# Patient Record
Sex: Male | Born: 1995 | Race: Black or African American | Hispanic: No | Marital: Single | Smoking: Current some day smoker
Health system: Southern US, Community
[De-identification: ages and names within clinical notes are randomized; demographics above are authoritative.]

## PROBLEM LIST (undated history)

## (undated) DIAGNOSIS — Q681 Congenital deformity of finger(s) and hand: Secondary | ICD-10-CM

## (undated) DIAGNOSIS — F32A Depression, unspecified: Secondary | ICD-10-CM

## (undated) DIAGNOSIS — F329 Major depressive disorder, single episode, unspecified: Secondary | ICD-10-CM

## (undated) HISTORY — PX: HAND SURGERY: SHX662

---
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
## 2013-04-23 ENCOUNTER — Emergency Department (HOSPITAL_COMMUNITY): Payer: Medicaid Other

## 2013-04-23 ENCOUNTER — Encounter (HOSPITAL_COMMUNITY): Payer: Self-pay | Admitting: Emergency Medicine

## 2013-04-23 ENCOUNTER — Emergency Department (HOSPITAL_COMMUNITY)
Admission: EM | Admit: 2013-04-23 | Discharge: 2013-04-24 | Disposition: A | Discharge status: Home / Self Care | Payer: Medicaid Other | Attending: Emergency Medicine | Admitting: Emergency Medicine

## 2013-04-23 DIAGNOSIS — M25532 Pain in left wrist: Secondary | ICD-10-CM

## 2013-04-23 DIAGNOSIS — S6990XA Unspecified injury of unspecified wrist, hand and finger(s), initial encounter: Secondary | ICD-10-CM | POA: Insufficient documentation

## 2013-04-23 DIAGNOSIS — Y9239 Other specified sports and athletic area as the place of occurrence of the external cause: Secondary | ICD-10-CM | POA: Insufficient documentation

## 2013-04-23 DIAGNOSIS — F17200 Nicotine dependence, unspecified, uncomplicated: Secondary | ICD-10-CM | POA: Insufficient documentation

## 2013-04-23 DIAGNOSIS — W219XXA Striking against or struck by unspecified sports equipment, initial encounter: Secondary | ICD-10-CM | POA: Insufficient documentation

## 2013-04-23 DIAGNOSIS — Y9367 Activity, basketball: Secondary | ICD-10-CM | POA: Insufficient documentation

## 2013-04-23 DIAGNOSIS — Z87768 Personal history of other specified (corrected) congenital malformations of integument, limbs and musculoskeletal system: Secondary | ICD-10-CM | POA: Insufficient documentation

## 2013-04-23 DIAGNOSIS — Z8776 Personal history of (corrected) congenital malformations of integument, limbs and musculoskeletal system: Secondary | ICD-10-CM | POA: Insufficient documentation

## 2013-04-23 DIAGNOSIS — S59909A Unspecified injury of unspecified elbow, initial encounter: Secondary | ICD-10-CM | POA: Insufficient documentation
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
## 2013-04-24 MED ORDER — IBUPROFEN 800 MG PO TABS: 800.0000 mg | ORAL_TABLET | Freq: Three times a day (TID) | ORAL | Status: DC

## 2013-04-24 NOTE — ED Provider Notes (Signed)
Medical screening examination/treatment/procedure(s) were performed by non-physician practitioner and as supervising physician I was immediately available for consultation/collaboration.   Brandt Loosen, MD 04/24/13 [phone]
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
## 2013-05-07 ENCOUNTER — Encounter (HOSPITAL_COMMUNITY): Payer: Self-pay | Admitting: Emergency Medicine

## 2013-05-07 ENCOUNTER — Emergency Department (HOSPITAL_COMMUNITY)
Admission: EM | Admit: 2013-05-07 | Discharge: 2013-05-07 | Disposition: A | Discharge status: Home / Self Care | Payer: Medicaid Other | Attending: Emergency Medicine | Admitting: Emergency Medicine

## 2013-05-07 DIAGNOSIS — R1110 Vomiting, unspecified: Secondary | ICD-10-CM | POA: Insufficient documentation

## 2013-05-07 DIAGNOSIS — R5381 Other malaise: Secondary | ICD-10-CM | POA: Insufficient documentation

## 2013-05-07 DIAGNOSIS — Q740 Other congenital malformations of upper limb(s), including shoulder girdle: Secondary | ICD-10-CM | POA: Insufficient documentation

## 2013-05-07 DIAGNOSIS — R072 Precordial pain: Secondary | ICD-10-CM | POA: Insufficient documentation

## 2013-05-07 DIAGNOSIS — F17200 Nicotine dependence, unspecified, uncomplicated: Secondary | ICD-10-CM | POA: Insufficient documentation

## 2013-05-07 DIAGNOSIS — IMO0001 Reserved for inherently not codable concepts without codable children: Secondary | ICD-10-CM | POA: Insufficient documentation

## 2013-05-07 DIAGNOSIS — J069 Acute upper respiratory infection, unspecified: Secondary | ICD-10-CM | POA: Insufficient documentation

## 2013-05-07 MED ORDER — ACETAMINOPHEN 325 MG PO TABS
650.0000 mg | ORAL_TABLET | Freq: Once | ORAL | Status: AC
  Administered 2013-05-07: 650 mg via ORAL
  Filled 2013-05-07: qty 2

## 2013-05-07 MED ORDER — HYDROCOD POLST-CHLORPHEN POLST 10-8 MG/5ML PO LQCR: 5.0000 mL | Freq: Two times a day (BID) | ORAL | Status: DC | PRN

## 2013-05-07 NOTE — ED Provider Notes (Signed)
CSN: [id number]     Arrival date & time 05/07/13  1648 History  This chart was scribed for non-physician practitioner Darrell Ledger, PA-C working with Darrell Marion, MD by Darrell Jensen, ED Scribe. This patient was seen in room Darrell Jensen and the patient's care was started at 1648.    Chief Complaint  Patient presents with  . URI  . Cough    The history is provided by the patient. No language interpreter was used.    HPI Comments: Darrell Jensen is a 17 y.o. male with a PMH of a congenital hand deformity who presents to the Emergency Department complaining of gradual onset, constant nasal congestion that began 2 days ago. Pt states having an associated, intermittent cough that is non-productive.  He also has associated mid-sternal pain that is only present with coughing. No chest pain at rest. He also reports fatigue and myalgias.  Pt also reports having 3 episodes of post-tussive emesis today that brought up clear liquid. No hemoptysis.  He had a sore throat initially with the onset of his symptoms, which has already resolved. Pt states he has had the flu vaccine for this season. He has taken some Mucinex with mild relief. He denies fever, chills, change in appetite/activity, nausea, neck pain, neck stiffness, SOB, dysuria, diarrhea, fever, rash, abdominal pain, constipation, diarrhea, lower extremity swelling, calf pain, headache, dizziness, or lightheadedness. He denies any sick contacts.  No history of heart problems or clotting/DVT/PE in the past.  No recent travel or surgery.     Past Medical History  Diagnosis Date  . Hand deformity, congenital    Past Surgical History  Procedure Laterality Date  . Hand surgery     Family History  Problem Relation Age of Onset  . Hypertension Other    History  Substance Use Topics  . Smoking status: Current Every Day Smoker -- 0.10 packs/day    Types: Cigarettes  . Smokeless tobacco: Not on file  . Alcohol Use: No    Review of Systems   Constitutional: Positive for fatigue. Negative for fever, chills, diaphoresis, activity change and appetite change.  HENT: Positive for congestion, rhinorrhea and sore throat (resolved). Negative for ear pain, nosebleeds, sinus pressure, sneezing, tinnitus, trouble swallowing and voice change.   Eyes: Negative for photophobia and visual disturbance.  Respiratory: Positive for cough. Negative for shortness of breath and wheezing.   Cardiovascular: Positive for chest pain (intermittent). Negative for leg swelling.  Gastrointestinal: Positive for vomiting. Negative for nausea, abdominal pain, diarrhea and constipation.  Genitourinary: Negative for dysuria.  Musculoskeletal: Positive for myalgias. Negative for arthralgias, back pain, joint swelling, neck pain and neck stiffness.  Skin: Negative for rash.  Neurological: Negative for dizziness, syncope, weakness, light-headedness, numbness and headaches.  All other systems reviewed and are negative.    Allergies  Review of patient's allergies indicates no known allergies.  Home Medications   Current Outpatient Rx  Name  Route  Sig  Dispense  Refill  . ibuprofen (ADVIL,MOTRIN) 800 MG tablet   Oral   Take 800 mg by mouth every 8 (eight) hours as needed for pain.          BP 135/73  Pulse 97  Temp(Src) 98.9 F (37.2 C) (Oral)  Resp 18  Ht 5\' 11"  (1.803 m)  Wt 188 lb 1 oz (85.305 kg)  BMI 26.24 kg/m2  SpO2 97%  Filed Vitals:   05/07/13 1651 05/07/13 1740  BP: 135/73 128/75  Pulse: 97 92  Temp:  98.9 F (37.2 C) 98.6 F (37 C)  TempSrc: Oral   Resp: 18 18  Height: 5\' 11"  (1.803 m)   Weight: 188 lb 1 oz (85.305 kg)   SpO2: 97% 98%    Physical Exam  Nursing note and vitals reviewed. Constitutional: He is oriented to person, place, and time. He appears well-developed and well-nourished. No distress.  HENT:  Head: Normocephalic and atraumatic.  Right Ear: External ear normal.  Left Ear: External ear normal.  Nose: Nose  normal.  Mouth/Throat: Oropharynx is clear and moist. No oropharyngeal exudate.  Uvula midline. No trismus. No erythema or exudates to the posterior pharynx.  Rhinorrhea.  Nasal congestion.  TM's gray and translucent bilaterally.  No sinus tenderness.    Eyes: Conjunctivae and EOM are normal. Pupils are equal, round, and reactive to light. Right eye exhibits no discharge. Left eye exhibits no discharge.  Neck: Normal range of motion. Neck supple.  No LAD or tenderness to the neck throughout  Cardiovascular: Normal rate, regular rhythm and intact distal pulses.  Exam reveals no gallop and no friction rub.   No murmur heard. Pulmonary/Chest: Effort normal and breath sounds normal. No respiratory distress. He has no wheezes. He has no rales. He exhibits tenderness.  Mid-sternal tenderness to palpation  Abdominal: Soft. Bowel sounds are normal. He exhibits no distension and no mass. There is no tenderness. There is no rebound and no guarding.  Musculoskeletal: Normal range of motion. He exhibits no edema and no tenderness.  No calf tenderness or edema bilaterally.  Patient able to ambulate without difficulty or ataxia   Lymphadenopathy:    He has no cervical adenopathy.  Neurological: He is alert and oriented to person, place, and time.  Skin: Skin is warm and dry. No rash noted. He is not diaphoretic.  Psychiatric: He has a normal mood and affect. His behavior is normal.    ED Course  Procedures   DIAGNOSTIC STUDIES: Oxygen Saturation is 97% on RA, normal by my interpretation.    COORDINATION OF CARE: 5:18 PM Discussed treatment plan with pt at bedside and pt agreed to plan.   Labs Review Labs Reviewed - No data to display Imaging Review No results found.  EKG Interpretation   None       MDM   1. URI (upper respiratory infection)     Darrell Jensen is a 17 y.o. male with a PMH of a congenital hand deformity who presents to the Emergency Department complaining of gradual  onset, constant nasal congestion that began 2 days ago.  Tylenol ordered.      Etiology of multiple complaints is likely due to an URI.  Patient was afebrile, non-toxic, and remained in no acute distress throughout his ED visit.  His Wells score was 0 and he is PERC negative.  He had no complaints of SOB and his vital signs were stable.  He had chest pain with coughing only and no chest pain at rest, which was reproducible.  He has low risk for cardiac disease.  Patient was prescribed Tussionex for outpatient management of his cough.  Patient instructed to follow-up with a PCP if his symptoms are not improving or worsening.  Patient encouraged to try Sudafed or other nasal decongestants.  Patient was instructed to return to the ED if they experience any coughing up blood, fever, difficulty breathing/swallowing, stiff neck/neck swelling, severe headache, or other concerns.  Patient was in agreement with discharge and plan.     Final impressions:  1. URI     Luiz Iron PA-C   I personally performed the services described in this documentation, which was scribed in my presence. The recorded information has been reviewed and is accurate.       Darrell Ledger, PA-C 05/09/13 1041

## 2013-05-07 NOTE — ED Notes (Signed)
Pt c/o URI sx with body aches x 2 days; pt sts nasal congestion

## 2013-05-12 NOTE — ED Provider Notes (Signed)
Medical screening examination/treatment/procedure(s) were performed by non-physician practitioner and as supervising physician I was immediately available for consultation/collaboration.  EKG Interpretation   None         Roney Marion, MD 05/12/13 1106

## 2013-08-01 ENCOUNTER — Emergency Department (HOSPITAL_COMMUNITY)
Admission: EM | Admit: 2013-08-01 | Discharge: 2013-08-02 | Disposition: A | Discharge status: Home / Self Care | Payer: Medicaid Other | Attending: Emergency Medicine | Admitting: Emergency Medicine

## 2013-08-01 ENCOUNTER — Encounter (HOSPITAL_COMMUNITY): Payer: Self-pay | Admitting: Emergency Medicine

## 2013-08-01 DIAGNOSIS — Y9210 Unspecified residential institution as the place of occurrence of the external cause: Secondary | ICD-10-CM | POA: Insufficient documentation

## 2013-08-01 DIAGNOSIS — W2209XA Striking against other stationary object, initial encounter: Secondary | ICD-10-CM | POA: Insufficient documentation

## 2013-08-01 DIAGNOSIS — Y9389 Activity, other specified: Secondary | ICD-10-CM | POA: Insufficient documentation

## 2013-08-01 DIAGNOSIS — Z87798 Personal history of other (corrected) congenital malformations: Secondary | ICD-10-CM | POA: Insufficient documentation

## 2013-08-01 DIAGNOSIS — F17200 Nicotine dependence, unspecified, uncomplicated: Secondary | ICD-10-CM | POA: Insufficient documentation

## 2013-08-01 DIAGNOSIS — S61411A Laceration without foreign body of right hand, initial encounter: Secondary | ICD-10-CM

## 2013-08-01 DIAGNOSIS — S61409A Unspecified open wound of unspecified hand, initial encounter: Secondary | ICD-10-CM | POA: Insufficient documentation

## 2013-08-01 NOTE — ED Notes (Signed)
Laceration rt thumb on a telephone.  He has deformity in his fingers normallybut the pt also has difficulty straightening his middle finger

## 2013-08-02 ENCOUNTER — Emergency Department (HOSPITAL_COMMUNITY): Payer: Medicaid Other
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
## 2013-08-02 NOTE — ED Provider Notes (Signed)
Medical screening examination/treatment/procedure(s) were performed by non-physician practitioner and as supervising physician I was immediately available for consultation/collaboration.    Rubi Tooley M Vermell Madrid, MD 08/02/13 1659 

## 2013-08-02 NOTE — Discharge Instructions (Signed)
Take 325mg  tylenol or 600-800mg  ibuprofen as needed for pain.  Keep wound clean and dry.  [facility] your primary [facility] doctor [facility] [phone]; [address])  in 7-10 days for wound recheck and suture removal.  You should be seen sooner if you develop fever, worsening pain or redness/drainage of pus at site of wound.

## 2014-03-25 ENCOUNTER — Encounter (HOSPITAL_COMMUNITY): Payer: Self-pay | Admitting: Emergency Medicine

## 2014-03-25 ENCOUNTER — Emergency Department (HOSPITAL_COMMUNITY): Payer: Medicaid Other

## 2014-03-25 ENCOUNTER — Emergency Department (HOSPITAL_COMMUNITY)
Admission: EM | Admit: 2014-03-25 | Discharge: 2014-03-25 | Disposition: A | Discharge status: Home / Self Care | Payer: Medicaid Other | Attending: Emergency Medicine | Admitting: Emergency Medicine

## 2014-03-25 DIAGNOSIS — Z87768 Personal history of other specified (corrected) congenital malformations of integument, limbs and musculoskeletal system: Secondary | ICD-10-CM | POA: Insufficient documentation

## 2014-03-25 DIAGNOSIS — Z8776 Personal history of (corrected) congenital malformations of integument, limbs and musculoskeletal system: Secondary | ICD-10-CM | POA: Diagnosis not present

## 2014-03-25 DIAGNOSIS — F17200 Nicotine dependence, unspecified, uncomplicated: Secondary | ICD-10-CM | POA: Insufficient documentation

## 2014-03-25 DIAGNOSIS — M79645 Pain in left finger(s): Secondary | ICD-10-CM

## 2014-03-25 DIAGNOSIS — M79609 Pain in unspecified limb: Secondary | ICD-10-CM | POA: Insufficient documentation

## 2014-03-25 MED ORDER — TRAMADOL HCL 50 MG PO TABS: 50.0000 mg | ORAL_TABLET | Freq: Four times a day (QID) | ORAL | Status: DC | PRN

## 2014-03-25 MED ORDER — TRAMADOL HCL 50 MG PO TABS
50.0000 mg | ORAL_TABLET | Freq: Once | ORAL | Status: AC
  Administered 2014-03-25: 50 mg via ORAL
  Filled 2014-03-25: qty 1

## 2014-03-25 NOTE — ED Notes (Signed)
Declined W/C at D/C and was escorted to lobby by RN. 

## 2014-03-25 NOTE — ED Notes (Addendum)
Pt reports left pinky pain, thinks it might be broken but doesn't remember hurting it. Reports got out of prison in June, has been hurting since before but parole officer just allowed him to leave his house today. Can move fingers. Sensation present. Has congenital hand disease

## 2014-03-25 NOTE — ED Provider Notes (Signed)
Medical screening examination/treatment/procedure(s) were performed by non-physician practitioner and as supervising physician I was immediately available for consultation/collaboration.  Flint Melter, MD 03/25/14 [phone]

## 2014-03-25 NOTE — ED Provider Notes (Signed)
CSN: [id number]     Arrival date & time 03/25/14  1451 History  This chart was scribed for non-physician practitioner, Junious Silk, PA-C,working with Flint Melter, MD, by Karle Plumber, ED Scribe. This patient was seen in room TR09C/TR09C and the patient's care was started at 4:37 PM.  Chief Complaint  Patient presents with  . Finger Injury   The history is provided by the patient. No language interpreter was used.   HPI Comments:  Darrell Jensen is a 18 y.o. male who presents to the Emergency Department complaining of an injury to his left fifth finger that started two days ago. Pt states he does not recall any trauma or injury to the finger but states he just woke up with pain one morning. He states the finger "just doesn't look right". He has taken Ibuprofen for the pain with minimal relief. He denies numbness or tingling of the finger or left hand. He is right hand dominant. He does have congenital hand condition causing his fingers to be deformed.   Past Medical History  Diagnosis Date  . Hand deformity, congenital    Past Surgical History  Procedure Laterality Date  . Hand surgery     Family History  Problem Relation Age of Onset  . Hypertension Other    History  Substance Use Topics  . Smoking status: Current Every Day Smoker -- 0.10 packs/day    Types: Cigarettes  . Smokeless tobacco: Not on file  . Alcohol Use: No    Review of Systems  Musculoskeletal: Positive for arthralgias.  All other systems reviewed and are negative.   Allergies  Review of patient's allergies indicates no known allergies.  Home Medications   Prior to Admission medications   Medication Sig Start Date End Date Taking? Authorizing Provider  ibuprofen (ADVIL,MOTRIN) 800 MG tablet Take 800 mg by mouth every 8 (eight) hours as needed for pain.   Yes Historical Provider, MD   Triage Vitals: BP 113/70  Pulse 64  Temp(Src) 98.4 F (36.9 C) (Oral)  Resp 15  SpO2 100% Physical Exam   Nursing note and vitals reviewed. Constitutional: He is oriented to person, place, and time. He appears well-developed and well-nourished. No distress.  HENT:  Head: Normocephalic and atraumatic.  Right Ear: External ear normal.  Left Ear: External ear normal.  Nose: Nose normal.  Eyes: Conjunctivae are normal.  Neck: Normal range of motion. No tracheal deviation present.  Cardiovascular: Normal rate, regular rhythm, normal heart sounds, intact distal pulses and normal pulses.   Pulses:      Radial pulses are 2+ on the right side, and 2+ on the left side.  Capillary refill < 3 seconds in all fingers.   Pulmonary/Chest: Effort normal and breath sounds normal. No stridor.  Abdominal: Soft. He exhibits no distension. There is no tenderness.  Musculoskeletal: Normal range of motion.  Deformities to all fingers. Tender to palpation over left fifth finger. No swelling, erythema, redness. Sensation intact. Full ROM.   Neurological: He is alert and oriented to person, place, and time.  Skin: Skin is warm and dry. He is not diaphoretic.  Psychiatric: He has a normal mood and affect. His behavior is normal.    ED Course  Procedures (including critical care time) DIAGNOSTIC STUDIES: Oxygen Saturation is 100% on RA, normal by my interpretation.   COORDINATION OF CARE: 4:40 PM- Will splint finger and refer to hand specialist. Will prescribe pain medication. Pt verbalizes understanding and agrees to plan.  Medications  traMADol (ULTRAM) tablet 50 mg (50 mg Oral Given 03/25/14 1654)    Labs Review Labs Reviewed - No data to display  Imaging Review Dg Finger Little Left  03/25/2014   CLINICAL DATA:  Left fifth finger pain.  EXAM: LEFT LITTLE FINGER 2+V  COMPARISON:  March 25, 2008.  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 No fracture is noted. There appears to be possible subluxation of the fifth middle phalanx relative to the fifth proximal phalanx. No radiopaque  foreign body or soft tissue abnormality is noted.  IMPRESSION: No fracture is noted. Congenital anomaly as described above. Possible subluxation of the fifth middle phalanx relative to the proximal phalanx.   Electronically Signed   By: Roque Lias M.D.   On: 03/25/2014 16:27     EKG Interpretation None      MDM   Final diagnoses:  Finger pain, left    Patient presents to ED with left finger pain. XR shows possible subluxation of fifth middle phalanx relative to the proximal phalanx. Will splint finger and given hand surgery referral. Patient given tramadol for pain. Neurovascularly intact. Compartment soft. Discussed reasons to return to ED immediately. Vital signs stable for discharge. Patient / Family / Caregiver informed of clinical course, understand medical decision-making process, and agree with plan.   I personally performed the services described in this documentation, which was scribed in my presence. The recorded information has been reviewed and is accurate.    Mora Bellman, PA-C 03/25/14 2334

## 2014-03-25 NOTE — Progress Notes (Signed)
Orthopedic Tech Progress Note Patient Details:  Darrell Jensen 1996/05/03 [id number]  Ortho Devices Type of Ortho Device: Finger splint Ortho Device/Splint Location: LUE Ortho Device/Splint Interventions: Ordered;Application   [name] 03/25/2014, 4:59 PM

## 2014-05-12 ENCOUNTER — Emergency Department (HOSPITAL_COMMUNITY)
Admission: EM | Admit: 2014-05-12 | Discharge: 2014-05-13 | Disposition: A | Discharge status: Home / Self Care | Payer: Medicaid Other | Attending: Emergency Medicine | Admitting: Emergency Medicine

## 2014-05-12 ENCOUNTER — Encounter (HOSPITAL_COMMUNITY): Payer: Self-pay | Admitting: Emergency Medicine

## 2014-05-12 DIAGNOSIS — Z720 Tobacco use: Secondary | ICD-10-CM | POA: Diagnosis not present

## 2014-05-12 DIAGNOSIS — Z8776 Personal history of (corrected) congenital malformations of integument, limbs and musculoskeletal system: Secondary | ICD-10-CM | POA: Diagnosis not present

## 2014-05-12 DIAGNOSIS — F329 Major depressive disorder, single episode, unspecified: Secondary | ICD-10-CM | POA: Diagnosis not present

## 2014-05-12 DIAGNOSIS — F32A Depression, unspecified: Secondary | ICD-10-CM

## 2014-05-12 HISTORY — DX: Major depressive disorder, single episode, unspecified: F32.9

## 2014-05-12 HISTORY — DX: Depression, unspecified: F32.A

## 2014-05-12 LAB — COMPREHENSIVE METABOLIC PANEL
ALT: 35 U/L (ref 0–53)
AST: 25 U/L (ref 0–37)
Albumin: 4.7 g/dL (ref 3.5–5.2)
Alkaline Phosphatase: 63 U/L (ref 39–117)
Anion gap: 13 (ref 5–15)
BUN: 17 mg/dL (ref 6–23)
CO2: 25 mEq/L (ref 19–32)
Calcium: 9.9 mg/dL (ref 8.4–10.5)
Chloride: 104 mEq/L (ref 96–112)
Creatinine, Ser: 0.98 mg/dL (ref 0.50–1.35)
GFR calc Af Amer: >90 mL/min (ref >90)
GFR calc non Af Amer: >90 mL/min (ref >90)
Glucose, Bld: 99 mg/dL (ref 70–99)
Potassium: 4.4 mEq/L (ref 3.7–5.3)
Sodium: 142 mEq/L (ref 137–147)
Total Bilirubin: 0.5 mg/dL (ref 0.3–1.2)
Total Protein: 8.1 g/dL (ref 6.0–8.3)

## 2014-05-12 LAB — ETHANOL: Alcohol, Ethyl (B): <11 mg/dL (ref 0–11)

## 2014-05-12 LAB — CBC
HCT: 46 % (ref 39.0–52.0)
Hemoglobin: 15.7 g/dL (ref 13.0–17.0)
MCH: 28.5 pg (ref 26.0–34.0)
MCHC: 34.1 g/dL (ref 30.0–36.0)
MCV: 83.6 fL (ref 78.0–100.0)
Platelets: 188 10*3/uL (ref 150–400)
RBC: 5.5 MIL/uL (ref 4.22–5.81)
RDW: 12.3 % (ref 11.5–15.5)
WBC: 4.8 10*3/uL (ref 4.0–10.5)

## 2014-05-12 LAB — ACETAMINOPHEN LEVEL: Acetaminophen (Tylenol), Serum: <15 ug/mL (ref 10–30)

## 2014-05-12 LAB — SALICYLATE LEVEL: Salicylate Lvl: <2 mg/dL — ABNORMAL LOW (ref 2.8–20.0)
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
## 2014-05-13 NOTE — BH Assessment (Signed)
Assessment completed. Consulted Janann Augustori Burkett, NP who recommended that pt be evaluated by psychiatry in the am. Antony MaduraKelly Humes, PA-C is aware of the recommendation.

## 2014-05-13 NOTE — Discharge Instructions (Signed)

## 2015-05-16 ENCOUNTER — Encounter (HOSPITAL_COMMUNITY): Payer: Self-pay | Admitting: Emergency Medicine

## 2015-05-16 ENCOUNTER — Emergency Department (HOSPITAL_COMMUNITY)
Admission: EM | Admit: 2015-05-16 | Discharge: 2015-05-16 | Disposition: A | Discharge status: Home / Self Care | Payer: Medicaid Other | Attending: Emergency Medicine | Admitting: Emergency Medicine

## 2015-05-16 DIAGNOSIS — F329 Major depressive disorder, single episode, unspecified: Secondary | ICD-10-CM | POA: Insufficient documentation

## 2015-05-16 DIAGNOSIS — Z720 Tobacco use: Secondary | ICD-10-CM | POA: Insufficient documentation

## 2015-05-16 DIAGNOSIS — J039 Acute tonsillitis, unspecified: Secondary | ICD-10-CM | POA: Diagnosis not present

## 2015-05-16 DIAGNOSIS — Z8776 Personal history of (corrected) congenital malformations of integument, limbs and musculoskeletal system: Secondary | ICD-10-CM | POA: Diagnosis not present

## 2015-05-16 DIAGNOSIS — R599 Enlarged lymph nodes, unspecified: Secondary | ICD-10-CM | POA: Diagnosis present

## 2015-05-16 MED ORDER — AMOXICILLIN 500 MG PO CAPS: 500.0000 mg | ORAL_CAPSULE | Freq: Three times a day (TID) | ORAL | Status: DC

## 2015-05-16 MED ORDER — PREDNISONE 20 MG PO TABS
60.0000 mg | ORAL_TABLET | Freq: Once | ORAL | Status: AC
  Administered 2015-05-16: 60 mg via ORAL
  Filled 2015-05-16: qty 3

## 2015-05-16 NOTE — ED Provider Notes (Signed)
CSN: [id number]     Arrival date & time 05/16/15  0414 History   First MD Initiated Contact with Patient 05/16/15 [phone]     Chief Complaint  Patient presents with  . Lymphadenopathy     (Consider location/radiation/quality/duration/timing/severity/associated sxs/prior Treatment) HPI Comments: Patient presents to the ER for evaluation of sore throat and swollen glands. Patient reports that symptoms began yesterday. He has noticed pain with swallowing, sore throat, swelling under the left side of his jaw. He has not taken his temperature. There is no cough or chest congestion. Patient is not having any trouble breathing or swallowing.   Past Medical History  Diagnosis Date  . Hand deformity, congenital   . Depression    Past Surgical History  Procedure Laterality Date  . Hand surgery     Family History  Problem Relation Age of Onset  . Hypertension Other    Social History  Substance Use Topics  . Smoking status: Current Some Day Smoker -- 0.10 packs/day    Types: Cigarettes  . Smokeless tobacco: Never Used  . Alcohol Use: No    Review of Systems  HENT: Positive for sore throat.   All other systems reviewed and are negative.     Allergies  Review of patient's allergies indicates no known allergies.  Home Medications   Prior to Admission medications   Medication Sig Start Date End Date Taking? Authorizing Provider  ibuprofen (ADVIL,MOTRIN) 800 MG tablet Take 800 mg by mouth every 8 (eight) hours as needed for pain.    Historical Provider, MD  traMADol (ULTRAM) 50 MG tablet Take 1 tablet (50 mg total) by mouth every 6 (six) hours as needed. 03/25/14   Junious SilkHannah Merrell, PA-C   BP 136/87 mmHg  Pulse 72  Temp(Src) 98.2 F (36.8 C) (Oral)  Resp 16  Ht 5\' 9"  (1.753 m)  Wt 195 lb (88.451 kg)  BMI 28.78 kg/m2  SpO2 98% Physical Exam  Constitutional: He is oriented to person, place, and time. He appears well-developed and well-nourished. No distress.  HENT:  Head:  Normocephalic and atraumatic.  Right Ear: Hearing normal.  Left Ear: Hearing normal.  Nose: Nose normal.  Mouth/Throat: Mucous membranes are normal. Oropharyngeal exudate and posterior oropharyngeal erythema present.  Eyes: Conjunctivae and EOM are normal. Pupils are equal, round, and reactive to light.  Neck: Normal range of motion. Neck supple.  Cardiovascular: Regular rhythm, S1 normal and S2 normal.  Exam reveals no gallop and no friction rub.   No murmur heard. Pulmonary/Chest: Effort normal and breath sounds normal. No respiratory distress. He exhibits no tenderness.  Abdominal: Soft. Normal appearance and bowel sounds are normal. There is no hepatosplenomegaly. There is no tenderness. There is no rebound, no guarding, no tenderness at McBurney's point and negative Murphy's sign. No hernia.  Musculoskeletal: Normal range of motion.  Lymphadenopathy:    He has cervical adenopathy.       Right cervical: Superficial cervical adenopathy present.       Left cervical: Superficial cervical adenopathy present.  Neurological: He is alert and oriented to person, place, and time. He has normal strength. No cranial nerve deficit or sensory deficit. Coordination normal. GCS eye subscore is 4. GCS verbal subscore is 5. GCS motor subscore is 6.  Skin: Skin is warm, dry and intact. No rash noted. No cyanosis.  Psychiatric: He has a normal mood and affect. His speech is normal and behavior is normal. Thought content normal.  Nursing note and vitals reviewed.   ED  Course  Procedures (including critical care time) Labs Review Labs Reviewed - No data to display  Imaging Review No results found. I have personally reviewed and evaluated these images and lab results as part of my medical decision-making.   EKG Interpretation None      MDM   Final diagnoses:  None   tonsillitis  Presents with sore throat, or pharyngeal erythema and exudate with lymphadenopathy. No sign of allergic  reaction.    Gilda Crease, MD 05/16/15 [phone]

## 2015-05-16 NOTE — ED Notes (Signed)
Pt left with all his belongings and ambulated out of the treatment area.  

## 2015-05-16 NOTE — ED Notes (Signed)
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 Pt states he has swelling to lymph nodes on right side of neck/jaw. Pt is alert and oriented x 4. Pt states "it hurts/burns a little when he swallows".

## 2015-05-16 NOTE — Discharge Instructions (Signed)
Tonsillitis °Tonsillitis is an infection of the throat. This infection causes the tonsils to become red, tender, and puffy (swollen). Tonsils are groups of tissue at the back of your throat. If bacteria caused your infection, antibiotic medicine will be given to you. Sometimes symptoms of tonsillitis can be relieved with the use of steroid medicine. If your tonsillitis is severe and happens often, you may need to get your tonsils removed (tonsillectomy). °HOME CARE  °· Rest and sleep often. °· Drink enough fluids to keep your pee (urine) clear or pale yellow. °· While your throat is sore, eat soft or liquid foods like: °¨ Soup. °¨ Ice cream. °¨ Instant breakfast drinks. °· Eat frozen ice pops. °· Gargle with a warm or cold liquid to help soothe the throat. Gargle with a water and salt mix. Mix 1/4 teaspoon of salt and 1/4 teaspoon of baking soda in 1 cup of water. °· Only take medicines as told by your doctor. °· If you are given medicines (antibiotics), take them as told. Finish them even if you start to feel better. °GET HELP IF: °· You have large, tender lumps in your neck. °· You have a rash. °· You cough up green, yellow-brown, or bloody fluid. °· You cannot swallow liquids or food for 24 hours. °· You notice that only one of your tonsils is swollen. °GET HELP RIGHT AWAY IF:  °· You throw up (vomit). °· You have a very bad headache. °· You have a stiff neck. °· You have chest pain. °· You have trouble breathing or swallowing. °· You have bad throat pain, drooling, or your voice changes. °· You have bad pain not helped by medicine. °· You cannot fully open your mouth. °· You have redness, puffiness, or bad pain in the neck. °· You have a fever. °MAKE SURE YOU:  °· Understand these instructions. °· Will watch your condition. °· Will get help right away if you are not doing well or get worse. °  °This information is not intended to replace advice given to you by your health care provider. Make sure you discuss any  questions you have with your health care provider. °  °Document Released: 12/15/2007 Document Revised: 07/03/2013 Document Reviewed: 12/15/2012 °Elsevier Interactive Patient Education ©2016 [employer] ° °

## 2015-09-06 ENCOUNTER — Emergency Department (HOSPITAL_COMMUNITY)
Admission: EM | Admit: 2015-09-06 | Discharge: 2015-09-06 | Disposition: A | Discharge status: Home / Self Care | Payer: Medicaid Other | Attending: Emergency Medicine | Admitting: Emergency Medicine

## 2015-09-06 ENCOUNTER — Encounter (HOSPITAL_COMMUNITY): Payer: Self-pay | Admitting: Emergency Medicine

## 2015-09-06 ENCOUNTER — Emergency Department (HOSPITAL_COMMUNITY): Payer: Medicaid Other

## 2015-09-06 DIAGNOSIS — Y998 Other external cause status: Secondary | ICD-10-CM | POA: Diagnosis not present

## 2015-09-06 DIAGNOSIS — S3991XA Unspecified injury of abdomen, initial encounter: Secondary | ICD-10-CM | POA: Diagnosis present

## 2015-09-06 DIAGNOSIS — Y92410 Unspecified street and highway as the place of occurrence of the external cause: Secondary | ICD-10-CM | POA: Diagnosis not present

## 2015-09-06 DIAGNOSIS — S29001A Unspecified injury of muscle and tendon of front wall of thorax, initial encounter: Secondary | ICD-10-CM | POA: Insufficient documentation

## 2015-09-06 DIAGNOSIS — S4991XA Unspecified injury of right shoulder and upper arm, initial encounter: Secondary | ICD-10-CM | POA: Diagnosis not present

## 2015-09-06 DIAGNOSIS — Z8659 Personal history of other mental and behavioral disorders: Secondary | ICD-10-CM | POA: Insufficient documentation

## 2015-09-06 DIAGNOSIS — F17210 Nicotine dependence, cigarettes, uncomplicated: Secondary | ICD-10-CM | POA: Diagnosis not present

## 2015-09-06 DIAGNOSIS — Y9389 Activity, other specified: Secondary | ICD-10-CM | POA: Insufficient documentation

## 2015-09-06 DIAGNOSIS — Z792 Long term (current) use of antibiotics: Secondary | ICD-10-CM | POA: Insufficient documentation

## 2015-09-06 DIAGNOSIS — Q681 Congenital deformity of finger(s) and hand: Secondary | ICD-10-CM | POA: Insufficient documentation

## 2015-09-06 DIAGNOSIS — R0781 Pleurodynia: Secondary | ICD-10-CM

## 2015-09-06 MED ORDER — IBUPROFEN 800 MG PO TABS: 800.0000 mg | ORAL_TABLET | Freq: Three times a day (TID) | ORAL | Status: DC

## 2015-09-06 MED ORDER — KETOROLAC TROMETHAMINE 60 MG/2ML IM SOLN
60.0000 mg | Freq: Once | INTRAMUSCULAR | Status: AC
  Administered 2015-09-06: 60 mg via INTRAMUSCULAR
  Filled 2015-09-06: qty 2

## 2015-09-06 MED ORDER — METHOCARBAMOL 500 MG PO TABS: 500.0000 mg | ORAL_TABLET | Freq: Two times a day (BID) | ORAL | Status: DC

## 2015-09-06 NOTE — ED Notes (Signed)
Pt ambulates independently and with steady gait at time of discharge. Discharge instructions and follow up information reviewed with patient. No other questions or concerns voiced at this time. RX x 2 given. 
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
## 2016-12-07 ENCOUNTER — Encounter (HOSPITAL_COMMUNITY): Payer: Self-pay

## 2016-12-07 ENCOUNTER — Emergency Department (HOSPITAL_COMMUNITY)
Admission: EM | Admit: 2016-12-07 | Discharge: 2016-12-07 | Disposition: A | Discharge status: Home / Self Care | Payer: Medicaid Other | Attending: Emergency Medicine | Admitting: Emergency Medicine

## 2016-12-07 DIAGNOSIS — Z79899 Other long term (current) drug therapy: Secondary | ICD-10-CM | POA: Insufficient documentation

## 2016-12-07 DIAGNOSIS — F17210 Nicotine dependence, cigarettes, uncomplicated: Secondary | ICD-10-CM | POA: Insufficient documentation

## 2016-12-07 DIAGNOSIS — R112 Nausea with vomiting, unspecified: Secondary | ICD-10-CM | POA: Insufficient documentation

## 2016-12-07 LAB — CBC
HEMATOCRIT: 47.4 % (ref 39.0–52.0)
HEMOGLOBIN: 15.7 g/dL (ref 13.0–17.0)
MCH: 28.5 pg (ref 26.0–34.0)
MCHC: 33.1 g/dL (ref 30.0–36.0)
MCV: 86 fL (ref 78.0–100.0)
PLATELETS: 155 10*3/uL (ref 150–400)
RBC: 5.51 MIL/uL (ref 4.22–5.81)
RDW: 12.7 % (ref 11.5–15.5)
WBC: 4.2 10*3/uL (ref 4.0–10.5)

## 2016-12-07 LAB — COMPREHENSIVE METABOLIC PANEL
ALT: 27 U/L (ref 17–63)
AST: 22 U/L (ref 15–41)
Albumin: 4.2 g/dL (ref 3.5–5.0)
Alkaline Phosphatase: 54 U/L (ref 38–126)
Anion gap: 9 (ref 5–15)
BUN: 14 mg/dL (ref 6–20)
CHLORIDE: 108 mmol/L (ref 101–111)
CO2: 21 mmol/L — AB (ref 22–32)
CREATININE: 0.96 mg/dL (ref 0.61–1.24)
Calcium: 9.3 mg/dL (ref 8.9–10.3)
GFR calc non Af Amer: >60 mL/min (ref >60)
Glucose, Bld: 95 mg/dL (ref 65–99)
POTASSIUM: 3.8 mmol/L (ref 3.5–5.1)
Sodium: 138 mmol/L (ref 135–145)
Total Bilirubin: 0.3 mg/dL (ref 0.3–1.2)
Total Protein: 6.8 g/dL (ref 6.5–8.1)

## 2016-12-07 LAB — URINALYSIS, ROUTINE W REFLEX MICROSCOPIC
Bilirubin Urine: NEGATIVE
Glucose, UA: NEGATIVE mg/dL
HGB URINE DIPSTICK: NEGATIVE
Ketones, ur: NEGATIVE mg/dL
LEUKOCYTES UA: NEGATIVE
Nitrite: NEGATIVE
PH: 5 (ref 5.0–8.0)
PROTEIN: NEGATIVE mg/dL
SPECIFIC GRAVITY, URINE: 1.024 (ref 1.005–1.030)

## 2016-12-07 LAB — LIPASE, BLOOD: LIPASE: 28 U/L (ref 11–51)

## 2016-12-07 MED ORDER — ONDANSETRON HCL 4 MG/2ML IJ SOLN
4.0000 mg | Freq: Once | INTRAMUSCULAR | Status: AC
  Administered 2016-12-07: 4 mg via INTRAVENOUS
  Filled 2016-12-07: qty 2

## 2016-12-07 MED ORDER — ONDANSETRON 4 MG PO TBDP: 4.0000 mg | ORAL_TABLET | Freq: Three times a day (TID) | ORAL | 0 refills | Status: DC | PRN

## 2016-12-07 NOTE — ED Notes (Signed)
Pt states drink has not made him nauseas  Informed pt if he does become to let us know.

## 2016-12-07 NOTE — ED Notes (Signed)
Pt aware of need for urine  

## 2016-12-07 NOTE — ED Triage Notes (Signed)
Pt from work with 2 episodes of vomiting with some red blood in it.

## 2016-12-07 NOTE — ED Provider Notes (Signed)
**Note Darrell-Identified via Obfuscation** [facility]-EMERGENCY DEPT Provider Note   CSN: [id number] Arrival date & time: 12/07/16  [id number]     History   Chief Complaint Chief Complaint  Patient presents with  . Emesis    HPI Darrell Jensen is a 21 y.o. male.  HPI   Darrell DuelDequan R Raul Jensen is a [age] y.o. male, presenting to the ED with nausea and vomiting beginning yesterday. Endorses three episodes of emesis. States there was some streaks of "red stuff" that looked like blood in the last episode of vomit.  Denies fever/chills, diarrhea, constipation, abdominal pain, chest pain, shortness of breath, or any other complaints. Endorses only occasional NSAID or alcohol use.    Past Medical History:  Diagnosis Date  . Depression   . Hand deformity, congenital     There are no active problems to display for this patient.   Past Surgical History:  Procedure Laterality Date  . HAND SURGERY         Home Medications    Prior to Admission medications   Medication Sig Start Date End Date Taking? Authorizing Provider  amoxicillin (AMOXIL) 500 MG capsule Take 1 capsule (500 mg total) by mouth 3 (three) times daily. 05/16/15   Gilda CreasePollina, Christopher J, MD  ibuprofen (ADVIL,MOTRIN) 800 MG tablet Take 1 tablet (800 mg total) by mouth 3 (three) times daily. 09/06/15   Kalab Camps C, PA-C  methocarbamol (ROBAXIN) 500 MG tablet Take 1 tablet (500 mg total) by mouth 2 (two) times daily. 09/06/15   Lanyla Costello C, PA-C  ondansetron (ZOFRAN ODT) 4 MG disintegrating tablet Take 1 tablet (4 mg total) by mouth every 8 (eight) hours as needed for nausea or vomiting. 12/07/16   Bula Cavalieri C, PA-C  traMADol (ULTRAM) 50 MG tablet Take 1 tablet (50 mg total) by mouth every 6 (six) hours as needed. Patient not taking: Reported on 05/16/2015 03/25/14   Junious SilkMerrell, Hannah, PA-C    Family History Family History  Problem Relation Age of Onset  . Hypertension Other     Social History Social History  Substance Use Topics  . Smoking status: Current Some Day Smoker      Packs/day: 1.00    Types: Cigarettes  . Smokeless tobacco: Never Used  . Alcohol use No     Allergies   Patient has no known allergies.   Review of Systems Review of Systems  Constitutional: Negative for chills and fever.  Respiratory: Negative for shortness of breath.   Cardiovascular: Negative for chest pain.  Gastrointestinal: Positive for nausea and vomiting. Negative for abdominal pain, constipation and diarrhea.  Neurological: Negative for dizziness, weakness, light-headedness and headaches.  All other systems reviewed and are negative.    Physical Exam Updated Vital Signs BP 128/85 (BP Location: Right Arm)   Pulse 70   Temp 97.7 F (36.5 C) (Oral)   Resp 20   Ht 5\' 11"  (1.803 m)   Wt 88.5 kg (195 lb)   SpO2 97%   BMI 27.20 kg/m   Physical Exam  Constitutional: He appears well-developed and well-nourished. No distress.  HENT:  Head: Normocephalic and atraumatic.  Eyes: Conjunctivae are normal.  Neck: Neck supple.  Cardiovascular: Normal rate, regular rhythm, normal heart sounds and intact distal pulses.   Pulmonary/Chest: Effort normal and breath sounds normal. No respiratory distress.  Abdominal: Soft. There is no tenderness. There is no guarding.  Musculoskeletal: He exhibits no edema.  Lymphadenopathy:    He has no cervical adenopathy.  Neurological: He is alert.  Skin: Skin  is warm and dry. He is not diaphoretic.  Psychiatric: He has a normal mood and affect. His behavior is normal.  Nursing note and vitals reviewed.    ED Treatments / Results  Labs (all labs ordered are listed, but only abnormal results are displayed) Labs Reviewed  COMPREHENSIVE METABOLIC PANEL - Abnormal; Notable for the following:       Result Value   CO2 21 (*)    All other components within normal limits  LIPASE, BLOOD  CBC  URINALYSIS, ROUTINE W REFLEX MICROSCOPIC    EKG  EKG Interpretation None       Radiology No results found.  Procedures Procedures  (including critical care time)  Medications Ordered in ED Medications  ondansetron (ZOFRAN) injection 4 mg (4 mg Intravenous Given 12/07/16 0908)     Initial Impression / Assessment and Plan / ED Course  I have reviewed the triage vital signs and the nursing notes.  Pertinent labs & imaging results that were available during my care of the patient were reviewed by me and considered in my medical decision making (see chart for details).      Patient presents with nausea and vomiting beginning yesterday. Nontoxic-appearing and vital signs stable. No vomiting here in the ED. Able to pass an oral fluid challenge without difficulty. Small amounts of blood possible due to force of vomiting. GI follow up as needed. The patient was given instructions for home care as well as return precautions. Patient voices understanding of these instructions, accepts the plan, and is comfortable with discharge.  Vitals:   12/07/16 0900 12/07/16 0915 12/07/16 0930 12/07/16 0945  BP: (!) 132/97 125/88 136/88 119/83  Pulse: 72 63 65 63  Resp:      Temp:      TempSrc:      SpO2: 99% 98% 98% 98%  Weight:      Height:         Final Clinical Impressions(s) / ED Diagnoses   Final diagnoses:  Non-intractable vomiting with nausea, unspecified vomiting type    New Prescriptions New Prescriptions   ONDANSETRON (ZOFRAN ODT) 4 MG DISINTEGRATING TABLET    Take 1 tablet (4 mg total) by mouth every 8 (eight) hours as needed for nausea or vomiting.     Anselm Pancoast, PA-C 12/07/16 0957    Geoffery Lyons, MD 12/08/16 [phone]

## 2016-12-07 NOTE — Discharge Instructions (Signed)
°  Avoid NSAIDs (ibuprofen, naproxen, BC powder, etc), spicy foods, and alcohol.  Hand washing: Wash your hands throughout the day, but especially before and after touching the face, using the restroom, sneezing, coughing, or touching surfaces that have been coughed or sneezed upon. Hydration: Symptoms will be intensified and complicated by dehydration. Dehydration can also extend the duration of symptoms. Drink plenty of fluids and get plenty of rest. You should be drinking at least half a liter of water an hour to stay hydrated. Electrolyte drinks are also encouraged. You should be drinking enough fluids to make your urine light yellow, almost clear. If this is not the case, you are not drinking enough water. Please note that some of the treatments indicated below will not be effective if you are not adequately hydrated. Pain or fever: Tylenol for pain or fever.  Nausea/vomiting: Use the Zofran for nausea or vomiting.  Sore throat: Warm liquids or Chloraseptic spray may help soothe a sore throat. Gargle twice a day with a salt water solution made from a half teaspoon of salt in a cup of warm water.  Follow up: Follow up with a primary care provider, as needed, for any future management of this issue. May also follow up with a gastroenterologist should symptoms fail to resolve.

## 2017-09-29 ENCOUNTER — Emergency Department (HOSPITAL_COMMUNITY)
Admission: EM | Admit: 2017-09-29 | Discharge: 2017-09-29 | Disposition: A | Discharge status: Home / Self Care | Payer: Self-pay | Attending: Emergency Medicine | Admitting: Emergency Medicine

## 2017-09-29 ENCOUNTER — Emergency Department (HOSPITAL_COMMUNITY): Payer: Self-pay

## 2017-09-29 ENCOUNTER — Encounter (HOSPITAL_COMMUNITY): Payer: Self-pay

## 2017-09-29 ENCOUNTER — Other Ambulatory Visit: Payer: Self-pay

## 2017-09-29 DIAGNOSIS — R079 Chest pain, unspecified: Secondary | ICD-10-CM | POA: Insufficient documentation

## 2017-09-29 DIAGNOSIS — F17210 Nicotine dependence, cigarettes, uncomplicated: Secondary | ICD-10-CM | POA: Insufficient documentation

## 2017-09-29 LAB — BASIC METABOLIC PANEL
ANION GAP: 7 (ref 5–15)
BUN: 11 mg/dL (ref 6–20)
CHLORIDE: 106 mmol/L (ref 101–111)
CO2: 27 mmol/L (ref 22–32)
Calcium: 9.3 mg/dL (ref 8.9–10.3)
Creatinine, Ser: 0.93 mg/dL (ref 0.61–1.24)
GFR calc non Af Amer: >60 mL/min (ref >60)
Glucose, Bld: 94 mg/dL (ref 65–99)
POTASSIUM: 3.9 mmol/L (ref 3.5–5.1)
Sodium: 140 mmol/L (ref 135–145)

## 2017-09-29 LAB — CBC
HCT: 48 % (ref 39.0–52.0)
Hemoglobin: 16 g/dL (ref 13.0–17.0)
MCH: 28.7 pg (ref 26.0–34.0)
MCHC: 33.3 g/dL (ref 30.0–36.0)
MCV: 86.2 fL (ref 78.0–100.0)
Platelets: 173 10*3/uL (ref 150–400)
RBC: 5.57 MIL/uL (ref 4.22–5.81)
RDW: 12.6 % (ref 11.5–15.5)
WBC: 4.5 10*3/uL (ref 4.0–10.5)

## 2017-09-29 LAB — I-STAT TROPONIN, ED: Troponin i, poc: 0 ng/mL (ref 0.00–0.08)

## 2017-09-29 MED ORDER — PREDNISONE 20 MG PO TABS: ORAL_TABLET | ORAL | 0 refills | Status: DC

## 2017-09-29 MED ORDER — IBUPROFEN 800 MG PO TABS: 800.0000 mg | ORAL_TABLET | Freq: Three times a day (TID) | ORAL | 0 refills | Status: DC

## 2017-09-29 MED ORDER — KETOROLAC TROMETHAMINE 60 MG/2ML IM SOLN
60.0000 mg | Freq: Once | INTRAMUSCULAR | Status: AC
  Administered 2017-09-29: 60 mg via INTRAMUSCULAR
  Filled 2017-09-29: qty 2

## 2017-09-29 MED ORDER — ALBUTEROL SULFATE HFA 108 (90 BASE) MCG/ACT IN AERS
4.0000 | INHALATION_SPRAY | Freq: Once | RESPIRATORY_TRACT | Status: AC
  Administered 2017-09-29: 4 via RESPIRATORY_TRACT
  Filled 2017-09-29: qty 6.7

## 2017-09-29 MED ORDER — PREDNISONE 20 MG PO TABS
60.0000 mg | ORAL_TABLET | Freq: Once | ORAL | Status: AC
  Administered 2017-09-29: 60 mg via ORAL
  Filled 2017-09-29: qty 3

## 2017-09-29 NOTE — ED Notes (Signed)
ED Provider at bedside. 

## 2017-09-29 NOTE — ED Triage Notes (Signed)
Pt states for 2 weeks he has had chest pain and feels like his chest is caving in. States he has SOB, worse at night. No distress noted. Skin warm and dry.

## 2017-09-29 NOTE — ED Provider Notes (Signed)
Emergency Department Provider Note   I have reviewed the triage vital signs and the nursing notes.   HISTORY  Chief Complaint Chest Pain   HPI Darrell Jensen is a 22 y.o. male with a past medical history of arthrogryposis but no other medical problems or presents to the emergency department today with chest pain.  Patient states that he had what sound like an upper respiratory infection a couple weeks ago there is a persistent cough and then onset of chest pain since that time.  States that the chest pain is on the right side worse with a deep breath worse with palpation and has point tenderness.  States he does get short of breath and sometimes he will wake up at night with shortness of breath and coughing.  He states he is a smoker and has been smoking since he was "in middle school".  No fevers.  No sick contacts. No other associated or modifying symptoms.    Past Medical History:  Diagnosis Date  . Depression   . Hand deformity, congenital     There are no active problems to display for this patient.   Past Surgical History:  Procedure Laterality Date  . HAND SURGERY      Current Outpatient Rx  . Order #: [id number] Class: Print  . Order #: [id number] Class: Print    Allergies Patient has no known allergies.  Family History  Problem Relation Age of Onset  . Hypertension Other     Social History Social History   Tobacco Use  . Smoking status: Current Some Day Smoker    Packs/day: 1.00    Types: Cigarettes  . Smokeless tobacco: Never Used  Substance Use Topics  . Alcohol use: No  . Drug use: No    Review of Systems  All other systems negative except as documented in the HPI. All pertinent positives and negatives as reviewed in the HPI. ____________________________________________   PHYSICAL EXAM:  VITAL SIGNS: ED Triage Vitals [09/29/17 1047]  Enc Vitals Group     BP (!) 143/85     Pulse Rate 78     Resp 18     Temp 98.1 F (36.7 C)     Temp  Source Oral     SpO2 100 %    Constitutional: Alert and oriented. Well appearing and in no acute distress. Eyes: Conjunctivae are normal. PERRL. EOMI. Head: Atraumatic. Nose: No congestion/rhinnorhea. Mouth/Throat: Mucous membranes are moist.  Oropharynx non-erythematous. Neck: No stridor.  No meningeal signs.   Cardiovascular: Normal rate, regular rhythm. Good peripheral circulation. Grossly normal heart sounds.   Respiratory: Normal respiratory effort.  No retractions. Lungs diminished bilaterally prior to albuterol and afterwards open with mild and extra Tory wheezing.. Gastrointestinal: Soft and nontender. No distention.  Musculoskeletal: No lower extremity tenderness nor edema. No gross deformities of extremities. Neurologic:  Normal speech and language. No gross focal neurologic deficits are appreciated.  Skin:  Skin is warm, dry and intact. No rash noted.   ____________________________________________   LABS (all labs ordered are listed, but only abnormal results are displayed)  Labs Reviewed  BASIC METABOLIC PANEL  CBC  I-STAT TROPONIN, ED   ____________________________________________  EKG   EKG Interpretation  Date/Time:  Thursday September 29 2017 10:49:12 EDT Ventricular Rate:  68 PR Interval:  126 QRS Duration: 88 QT Interval:  378 QTC Calculation: 401 R Axis:   70 Text Interpretation:  Normal sinus rhythm with sinus arrhythmia T wave abnormality, consider inferior ischemia Abnormal ECG  TWI in aVF new since 2011 Confirmed by Marily Memos [phone]) on 09/29/2017 10:54:51 AM       ____________________________________________  RADIOLOGY  Dg Chest 2 View  Result Date: 09/29/2017 CLINICAL DATA:  Chest pain for several weeks EXAM: CHEST - 2 VIEW COMPARISON:  2/25/7 FINDINGS: The heart size and mediastinal contours are within normal limits. Both lungs are clear. The visualized skeletal structures are unremarkable. IMPRESSION: No active cardiopulmonary disease.  Electronically Signed   By: Alcide Clever M.D.   On: 09/29/2017 12:25    ____________________________________________   PROCEDURES  Procedure(s) performed:   Procedures   ____________________________________________   INITIAL IMPRESSION / ASSESSMENT AND PLAN / ED COURSE  X-ray reviewed and interpreted by myself as normal.  Labs reviewed and interpreted by myself as normal.  I think patient likely has bronchitis or post bronchitic cough.  His shortness of breath and cough and chest discomfort did improve with Toradol and albuterol. PERC negative, doubt PE. No fever or productive cough to suggest a bacterial infection needing antibiotics.  Will DC on steroids and albuterol with strict return precautions.     Pertinent labs & imaging results that were available during my care of the patient were reviewed by me and considered in my medical decision making (see chart for details).  ____________________________________________  FINAL CLINICAL IMPRESSION(S) / ED DIAGNOSES  Final diagnoses:  Nonspecific chest pain     MEDICATIONS GIVEN DURING THIS VISIT:  Medications  predniSONE (DELTASONE) tablet 60 mg (has no administration in time range)  ketorolac (TORADOL) injection 60 mg (60 mg Intramuscular Given 09/29/17 1328)  albuterol (PROVENTIL HFA;VENTOLIN HFA) 108 (90 Base) MCG/ACT inhaler 4 puff (4 puffs Inhalation Given 09/29/17 1327)     NEW OUTPATIENT MEDICATIONS STARTED DURING THIS VISIT:  New Prescriptions   PREDNISONE (DELTASONE) 20 MG TABLET    2 tabs po daily x 4 days    Note:  This note was prepared with assistance of Dragon voice recognition software. Occasional wrong-word or sound-a-like substitutions may have occurred due to the inherent limitations of voice recognition software.   Marily Memos, MD 09/29/17 1446

## 2017-10-06 ENCOUNTER — Emergency Department (HOSPITAL_COMMUNITY)
Admission: EM | Admit: 2017-10-06 | Discharge: 2017-10-06 | Disposition: A | Discharge status: Home / Self Care | Payer: Self-pay | Attending: Physician Assistant | Admitting: Physician Assistant

## 2017-10-06 DIAGNOSIS — F17210 Nicotine dependence, cigarettes, uncomplicated: Secondary | ICD-10-CM | POA: Insufficient documentation

## 2017-10-06 DIAGNOSIS — J209 Acute bronchitis, unspecified: Secondary | ICD-10-CM

## 2017-10-06 DIAGNOSIS — Z720 Tobacco use: Secondary | ICD-10-CM

## 2017-10-06 MED ORDER — DEXAMETHASONE SODIUM PHOSPHATE 10 MG/ML IJ SOLN
10.0000 mg | Freq: Once | INTRAMUSCULAR | Status: AC
  Administered 2017-10-06: 10 mg via INTRAMUSCULAR
  Filled 2017-10-06: qty 1

## 2017-10-06 MED ORDER — IPRATROPIUM-ALBUTEROL 0.5-2.5 (3) MG/3ML IN SOLN
3.0000 mL | Freq: Once | RESPIRATORY_TRACT | Status: AC
  Administered 2017-10-06: 3 mL via RESPIRATORY_TRACT
  Filled 2017-10-06: qty 3

## 2017-10-06 NOTE — ED Triage Notes (Signed)
Pt arrives by ems from work after feeling like he couldn't catch his breath and feeling anxious. Pt reports getting diagnosed with bronchitis last week and left his inhaler home while at work.

## 2017-10-06 NOTE — ED Provider Notes (Signed)
**Note Darrell-Identified via Obfuscation** [facility] Provider Note   CSN: [id number] Arrival date & time: 10/06/17  1348     History   Chief Complaint Chief Complaint  Patient presents with  . Shortness of Breath   HPI  Blood pressure 135/78, pulse 74, temperature 98.4 F (36.9 C), temperature source Oral, resp. rate 16, height 5\' 11"  (1.803 m), weight 77.1 kg (170 lb), SpO2 100 %.  Darrell Jensen is a 22 y.o. male complaining of an onset of shortness of breath the patient was working in a kitchen earlier in the day.  He was seen last week and diagnosed with acute bronchitis, he has been using his inhaler with some relief but he did not have it with him at work.  He has been unable to obtain the prednisone that was given to him because of issues with insurance.  He denies any cough, fever, chills, palpitations, increasing peripheral edema.  In general he feels like he is improving but he had a moment where he was very short of breath at work, this is essentially resolved.  Past Medical History:  Diagnosis Date  . Depression   . Hand deformity, congenital     There are no active problems to display for this patient.   Past Surgical History:  Procedure Laterality Date  . HAND SURGERY          Home Medications    Prior to Admission medications   Medication Sig Start Date End Date Taking? Authorizing Provider  ibuprofen (ADVIL,MOTRIN) 800 MG tablet Take 1 tablet (800 mg total) by mouth 3 (three) times daily. 09/29/17   Mesner, Barbara CowerJason, MD  predniSONE (DELTASONE) 20 MG tablet 2 tabs po daily x 4 days 09/29/17   Mesner, Barbara CowerJason, MD    Family History Family History  Problem Relation Age of Onset  . Hypertension Other     Social History Social History   Tobacco Use  . Smoking status: Current Some Day Smoker    Packs/day: 1.00    Types: Cigarettes  . Smokeless tobacco: Never Used  Substance Use Topics  . Alcohol use: No  . Drug use: No     Allergies   Patient has  no known allergies.   Review of Systems Review of Systems  A complete review of systems was obtained and all systems are negative except as noted in the HPI and PMH.   Physical Exam Updated Vital Signs BP 135/78 (BP Location: Right Arm)   Pulse 74   Temp 98.4 F (36.9 C) (Oral)   Resp 16   Ht 5\' 11"  (1.803 m)   Wt 77.1 kg (170 lb)   SpO2 100%   BMI 23.71 kg/m   Physical Exam  Constitutional: He is oriented to person, place, and time. He appears well-developed and well-nourished. No distress.  HENT:  Head: Normocephalic and atraumatic.  Right Ear: External ear normal.  Left Ear: External ear normal.  Mouth/Throat: Oropharynx is clear and moist. No oropharyngeal exudate.  No drooling or stridor. Posterior pharynx mildly erythematous no significant tonsillar hypertrophy. No exudate. Soft palate rises symmetrically. No TTP or induration under tongue.   No tenderness to palpation of frontal or bilateral maxillary sinuses.  Mild mucosal edema in the nares with scant rhinorrhea.  Bilateral tympanic membranes with normal architecture and good light reflex.    Eyes: Pupils are equal, round, and reactive to light. Conjunctivae and EOM are normal.  Neck: Normal range of motion. Neck supple. No JVD present. No  tracheal deviation present.  Cardiovascular: Normal rate, regular rhythm and intact distal pulses.  Radial pulse equal bilaterally  Pulmonary/Chest: Effort normal and breath sounds normal. No stridor. No respiratory distress. He has no wheezes. He has no rales. He exhibits no tenderness.  Abdominal: Soft. He exhibits no distension and no mass. There is no tenderness. There is no rebound and no guarding.  Musculoskeletal: Normal range of motion. He exhibits no edema or tenderness.  No calf asymmetry, superficial collaterals, palpable cords, edema, Homans sign negative bilaterally.    Neurological: He is alert and oriented to person, place, and time.  Skin: Skin is warm. He is  not diaphoretic.  Psychiatric: He has a normal mood and affect.  Nursing note and vitals reviewed.    ED Treatments / Results  Labs (all labs ordered are listed, but only abnormal results are displayed) Labs Reviewed - No data to display  EKG None  Radiology No results found.  Procedures Procedures (including critical care time)  Medications Ordered in ED Medications  dexamethasone (DECADRON) injection 10 mg (has no administration in time range)  ipratropium-albuterol (DUONEB) 0.5-2.5 (3) MG/3ML nebulizer solution 3 mL (3 mLs Nebulization Given 10/06/17 1420)     Initial Impression / Assessment and Plan / ED Course  I have reviewed the triage vital signs and the nursing notes.  Pertinent labs & imaging results that were available during my care of the patient were reviewed by me and considered in my medical decision making (see chart for details).     Vitals:   10/06/17 1354  BP: 135/78  Pulse: 74  Resp: 16  Temp: 98.4 F (36.9 C)  TempSrc: Oral  SpO2: 100%  Weight: 77.1 kg (170 lb)  Height: 5\' 11"  (1.803 m)    Medications  dexamethasone (DECADRON) injection 10 mg (has no administration in time range)  ipratropium-albuterol (DUONEB) 0.5-2.5 (3) MG/3ML nebulizer solution 3 mL (3 mLs Nebulization Given 10/06/17 1420)    Darrell Jensen is 22 y.o. male presenting with an episode of resolved shortness of breath while at work, recently diagnosed with acute bronchitis he did not have his inhaler with him at the time, there is been no cough or fever, lung sounds clear to auscultation.  Sensation of shortness of breath is resolved, there is no associated chest pain, increasing peripheral edema.  He has been unable to obtain his prednisone, will give a shot of Decadron in the ED.  Work note provided Chief Operating Officer given.  Evaluation does not show pathology that would require ongoing emergent intervention or inpatient treatment. Pt is hemodynamically stable and mentating  appropriately. Discussed findings and plan with patient/guardian, who agrees with care plan. All questions answered. Return precautions discussed and outpatient follow up given.      Final Clinical Impressions(s) / ED Diagnoses   Final diagnoses:  Acute bronchitis, unspecified organism  Tobacco use    ED Discharge Orders    None       Lynetta Mare Mardella Layman 10/06/17 1505    Mackuen, Cindee Salt, MD 10/07/17 1948

## 2017-10-06 NOTE — ED Provider Notes (Signed)
**Note Darrell-Identified via Obfuscation** Patient placed in Quick Look pathway, seen and evaluated   Chief Complaint: shortness of breath  HPI:   Darrell Jensen is a 22 y.o. male who presents to the ED via EMS for shortness of breath that started while he was at work today. Patient states he was evaluated one week ago in the ED and treated for bronchitis and given an inhaler. atient forgot to take the inhaler to work today. Patient states he felt like he couldn't catch his breath.   ROS: Resp: shortness of breath  Physical Exam:  BP 135/78 (BP Location: Right Arm)   Pulse 74   Temp 98.4 F (36.9 C) (Oral)   Resp 16   Ht 5\' 11"  (1.803 m)   Wt 77.1 kg (170 lb)   SpO2 100%   BMI 23.71 kg/m    Gen: No distress  Neuro: Awake and Alert  Skin: Warm and dry  Resp: decreased breath sounds bilateral    Focused Exam:    Initiation of care has begun. The patient has been counseled on the process, plan, and necessity for staying for the completion/evaluation, and the remainder of the medical screening examination     Janne Napoleoneese, Hope M, NP 10/06/17 1414    Abelino DerrickMackuen, Courteney Lyn, MD 10/07/17 1948

## 2017-10-06 NOTE — Discharge Instructions (Addendum)
Try to quit smoking.  Do not hesitate to return to the emergency room for any new, worsening or concerning symptoms.  Please obtain primary care using resource guide below. Let them know that you were seen in the emergency room and that they will need to obtain records for further outpatient management.

## 2020-01-23 ENCOUNTER — Ambulatory Visit (INDEPENDENT_AMBULATORY_CARE_PROVIDER_SITE_OTHER): Payer: Self-pay

## 2020-01-23 ENCOUNTER — Other Ambulatory Visit: Payer: Self-pay

## 2020-01-23 ENCOUNTER — Ambulatory Visit (HOSPITAL_COMMUNITY)
Admission: EM | Admit: 2020-01-23 | Discharge: 2020-01-23 | Disposition: A | Discharge status: Home / Self Care | Payer: Self-pay | Attending: Emergency Medicine | Admitting: Emergency Medicine

## 2020-01-23 ENCOUNTER — Encounter (HOSPITAL_COMMUNITY): Payer: Self-pay

## 2020-01-23 DIAGNOSIS — S62525A Nondisplaced fracture of distal phalanx of left thumb, initial encounter for closed fracture: Secondary | ICD-10-CM | POA: Insufficient documentation

## 2020-01-23 DIAGNOSIS — M79645 Pain in left finger(s): Secondary | ICD-10-CM

## 2020-01-23 DIAGNOSIS — S6992XA Unspecified injury of left wrist, hand and finger(s), initial encounter: Secondary | ICD-10-CM

## 2020-01-23 DIAGNOSIS — Z202 Contact with and (suspected) exposure to infections with a predominantly sexual mode of transmission: Secondary | ICD-10-CM | POA: Insufficient documentation

## 2020-01-23 MED ORDER — AZITHROMYCIN 250 MG PO TABS
1000.0000 mg | ORAL_TABLET | Freq: Once | ORAL | Status: AC
  Administered 2020-01-23: 19:00:00 1000 mg via ORAL

## 2020-01-23 MED ORDER — METRONIDAZOLE 500 MG PO TABS: 2000.0000 mg | ORAL_TABLET | Freq: Once | ORAL | 0 refills | Status: AC

## 2020-01-23 MED ORDER — IBUPROFEN 800 MG PO TABS: 800.0000 mg | ORAL_TABLET | Freq: Three times a day (TID) | ORAL | 0 refills | Status: AC

## 2020-01-23 MED ORDER — AZITHROMYCIN 250 MG PO TABS
ORAL_TABLET | ORAL | Status: AC
  Filled 2020-01-23: qty 4

## 2020-01-23 NOTE — ED Triage Notes (Signed)
Pt c/o left thumb pain and swelling after shutting hand in door yesterday  Pt also requesting STD testing and treatment after partner tested + for chlamydia

## 2020-01-23 NOTE — Discharge Instructions (Addendum)
We have treated you today for chlamydia, with azithromcyin. Take 4 tabs of flagyl with food later today to treat trichomonas. Please refrain from sexual activity for 7 days while medicine is clearing infection.  We are testing you for Gonorrhea, Chlamydia and Trichomonas. We will call you if anything is positive and let you know if you require any further treatment. Please inform partner of any positive results.  Please return if symptoms not improving with treatment, development of fever, nausea, vomiting, abdominal pain, scrotal pain.  There is a fracture in your thumb Wear splint 24/7 over the next month while healing Follow up with sports medicine to ensure healing Use anti-inflammatories for pain/swelling. You may take up to 800 mg Ibuprofen every 8 hours with food. You may supplement Ibuprofen with Tylenol [phone] mg every 8 hours.  Ice Follow up for any concerns

## 2020-01-24 LAB — CYTOLOGY, (ORAL, ANAL, URETHRAL) ANCILLARY ONLY
Chlamydia: POSITIVE — AB
Comment: NEGATIVE
Comment: NEGATIVE
Comment: NORMAL
Neisseria Gonorrhea: NEGATIVE
Trichomonas: NEGATIVE

## 2020-01-24 NOTE — ED Provider Notes (Signed)
[facility]    CSN: [id number] Arrival date & time: 01/23/20  1614      History   Chief Complaint Chief Complaint  Patient presents with  . Hand Pain  . Exposure to STD    HPI Darrell Jensen is a 24 y.o. 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  Patient reports that yesterday he slammed his left thumb into a door.  Since he has had pain swelling and increased stiffness to his thumb.  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  Denies prior fracture/injury.  Patient also reports that his partner recently tested positive for chlamydia and trichomonas.  He himself denies any symptoms of penile discharge, dysuria, testicle pain or swelling, abdominal pain.  Denies rashes or lesions.   HPI  Past Medical History:  Diagnosis Date  . Depression   . Hand deformity, congenital     There are no problems to display for this patient.   Past Surgical History:  Procedure Laterality Date  . HAND SURGERY         Home Medications    Prior to Admission medications   Medication Sig Start Date End Date Taking? Authorizing Provider  ibuprofen (ADVIL) 800 MG tablet Take 1 tablet (800 mg total) by mouth 3 (three) times daily. 01/23/20   Brandalynn Ofallon, Junius Creamer, PA-C    Family History Family History  Problem Relation Age of Onset  . Hypertension Other     Social History Social History   Tobacco Use  . Smoking status: Current Some Day Smoker    Packs/day: 1.00    Types: Cigarettes  . Smokeless tobacco: Never Used  Substance Use Topics  . Alcohol use: No  . Drug use: No     Allergies   Patient has no known allergies.   Review of Systems Review of Systems  Constitutional: Negative for fever.  HENT: Negative for sore throat.   Respiratory: Negative for shortness of breath.   Cardiovascular: Negative for chest pain.  Gastrointestinal: Negative for abdominal pain, nausea and vomiting.    Genitourinary: Negative for difficulty urinating, discharge, dysuria, frequency, penile pain, penile swelling, scrotal swelling and testicular pain.  Musculoskeletal: Positive for arthralgias and joint swelling.  Skin: Negative for rash.  Neurological: Negative for dizziness, light-headedness and headaches.     Physical Exam Triage Vital Signs ED Triage Vitals  Enc Vitals Group     BP 01/23/20 1748 (!) 136/93     Pulse Rate 01/23/20 1748 77     Resp 01/23/20 1748 18     Temp 01/23/20 1748 98.6 F (37 C)     Temp src --      SpO2 01/23/20 1748 100 %     Weight --      Height --      Head Circumference --      Peak Flow --      Pain Score 01/23/20 1904 2     Pain Loc --      Pain Edu? --      Excl. in GC? --    No data found.  Updated Vital Signs BP (!) 136/93   Pulse 77   Temp 98.6 F (37 C)   Resp 18   SpO2 100%   Visual Acuity Right Eye Distance:   Left Eye Distance:   Bilateral Distance:    Right Eye Near:   Left Eye Near:    Bilateral Near:  Physical Exam Vitals and nursing note reviewed.  Constitutional:      Appearance: He is well-developed.     Comments: No acute distress  HENT:     Head: Normocephalic and atraumatic.     Nose: Nose normal.  Eyes:     Conjunctiva/sclera: Conjunctivae normal.  Cardiovascular:     Rate and Rhythm: Normal rate.  Pulmonary:     Effort: Pulmonary effort is normal. No respiratory distress.  Abdominal:     General: There is no distension.  Genitourinary:    Comments: Penis circumcised, no rashes or lesions noted, no discharge noted in urethral meatus Musculoskeletal:        General: Normal range of motion.     Cervical back: Neck supple.     Comments: Bilateral hands with deformity, left thumb with tenderness to palpation of the interphalangeal joint and tenderness over distal phalanx mild erythema, but no obvious signs of paronychia, no subungual hematoma  Skin:    General: Skin is warm and dry.   Neurological:     Mental Status: He is alert and oriented to person, place, and time.      UC Treatments / Results  Labs (all labs ordered are listed, but only abnormal results are displayed) Labs Reviewed  CYTOLOGY, (ORAL, ANAL, URETHRAL) ANCILLARY ONLY    EKG   Radiology DG Finger Thumb Left  Result Date: 01/23/2020 CLINICAL DATA:  Left thumb injury, pain EXAM: LEFT THUMB 2+V COMPARISON:  None. FINDINGS: There is a mildly displaced intra-articular fracture of the radial base of the distal phalanx with the displaced fracture fragment comprising approximately 50% of the articular surface. No significant articular incongruity. Joint spaces have been preserved. Moderate surrounding soft tissue swelling. No other fracture or dislocation identified. IMPRESSION: Minimally displaced, anatomically aligned intra-articular fracture of the distal phalanx. Electronically Signed   By: Helyn Numbers MD   On: 01/23/2020 18:37    Procedures Procedures (including critical care time)  Medications Ordered in UC Medications  azithromycin (ZITHROMAX) tablet 1,000 mg (1,000 mg Oral Given 01/23/20 1831)    Initial Impression / Assessment and Plan / UC Course  I have reviewed the triage vital signs and the nursing notes.  Pertinent labs & imaging results that were available during my care of the patient were reviewed by me and considered in my medical decision making (see chart for details).     1.  Left thumb distal phalanx fracture, nondisplaced, placing an static splint and will have follow-up with sports medicine.  Ice, anti-inflammatories and immobilization.  2.  STD exposure-empirically treating for chlamydia and trichomonas today with 1 g azithromycin, sent home with 2 g Flagyl to take at home to avoid stomach irritation.  Urethral swab pending for further screening.  Provide further treatment as needed based off results.  Discussed strict return precautions. Patient verbalized  understanding and is agreeable with plan.  Final Clinical Impressions(s) / UC Diagnoses   Final diagnoses:  Closed nondisplaced fracture of distal phalanx of left thumb, initial encounter  STD exposure     Discharge Instructions     We have treated you today for chlamydia, with azithromcyin. Take 4 tabs of flagyl with food later today to treat trichomonas. Please refrain from sexual activity for 7 days while medicine is clearing infection.  We are testing you for Gonorrhea, Chlamydia and Trichomonas. We will call you if anything is positive and let you know if you require any further treatment. Please inform partner of any positive results.  Please return if symptoms not improving with treatment, development of fever, nausea, vomiting, abdominal pain, scrotal pain.  There is a fracture in your thumb Wear splint 24/7 over the next month while healing Follow up with sports medicine to ensure healing Use anti-inflammatories for pain/swelling. You may take up to 800 mg Ibuprofen every 8 hours with food. You may supplement Ibuprofen with Tylenol [phone] mg every 8 hours.  Ice Follow up for any concerns    ED Prescriptions    Medication Sig Dispense Auth. Provider   metroNIDAZOLE (FLAGYL) 500 MG tablet Take 4 tablets (2,000 mg total) by mouth once for 1 dose. 4 tablet Zade Falkner C, PA-C   ibuprofen (ADVIL) 800 MG tablet Take 1 tablet (800 mg total) by mouth 3 (three) times daily. 21 tablet Elige Shouse, [place] C, PA-C     PDMP not reviewed this encounter.   Lew Dawes, [place] 01/24/20 [phone]

## 2023-09-15 ENCOUNTER — Ambulatory Visit (HOSPITAL_COMMUNITY): Admission: EM | Admit: 2023-09-15 | Discharge: 2023-09-15 | Disposition: A | Discharge status: Home / Self Care

## 2023-09-15 NOTE — Progress Notes (Signed)
   09/15/23 1458  [place] Suicide Severity Rating Scale  1. In the past month - "Have you wished you were dead or wished you could go to sleep and not wake up?" No  2. In the past month - "Have you actually had any thoughts of killing yourself?" No  6. Have you ever done anything, started to do anything, or prepared to do anything to end your life?" Yes  7. "Was this within the past three months?" No  C-SSRS RISK CATEGORY Moderate Risk  Patient location: [facility]gent [facility]Fac[facility] Crisis [facility]  [facility] Suicide Precautions Interventions  [facility] Suicide Precautions Interventions Low Risk Interventions

## 2023-09-15 NOTE — ED Notes (Signed)
 Patient left without being seen.
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
## 2023-11-08 ENCOUNTER — Ambulatory Visit (HOSPITAL_COMMUNITY)
Admission: EM | Admit: 2023-11-08 | Discharge: 2023-11-08 | Disposition: A | Discharge status: Home / Self Care | Attending: Psychiatry | Admitting: Psychiatry

## 2023-11-08 DIAGNOSIS — Z008 Encounter for other general examination: Secondary | ICD-10-CM | POA: Insufficient documentation

## 2023-11-08 NOTE — ED Provider Notes (Signed)
 Behavioral Health Urgent Care Medical Screening Exam  Patient Name: Darrell Jensen MRN: [id number] Date of Evaluation: 11/08/23 Chief Complaint:  at the recommendation of his parole officer for mental health evaluation.  Diagnosis:  Final diagnoses:  Evaluation by psychiatric service required    History of Present illness: Darrell Jensen is a 28 y.o. male patient presented to [facility] as a walk in alone at the recommendation of his parole officer for mental health evaluation.   Darrell Jensen, 28 y.o., male patient seen face to face by this provider, chart reviewed, and consulted with Dr. Genita Keys on 11/08/23.  Patient currently has no psychiatric services in place.  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  He lives in the home with his "baby mama".  He denies all substance use.  Patient presents today at the recommendation of his parole officer for mental health evaluation.  He spent roughly 4 years in prison but has been on probation for quite some time.  He recently was switched to a new Engineer, drilling.  States this probation officer wants him to have a mental health evaluation.   Patient reports that when he was a teenager he was admitted to the behavioral health hospital due to making suicidal and homicidal comments.  He was on medications at that time but does not member what the medication was but stopped taking medications shortly after discharge.  He has received no mental health services since that time.  Currently on assessment Darrell Jensen alert/oriented x 4, cooperative, calm, and attentive.  His responses were relevant and appropriate to assessment questions.  He spoke in a clear tone at moderate volume, and normal pace with good eye contact.  He denies any symptoms of anxiety or depression and has a euthymic affect.  He denies suicidal/homicidal ideations.  He denies any access to firearms/weapons.  He denies auditory/visual hallucinations.  He  does not appear manic, psychotic, delusional, or paranoid.  He does not appear to be responding to internal/external stimuli.  He is not interested in medications at this time as he states he is not depressed.  He may possibly be interested in therapy but is not sure at this time.  He was provided with the open access walk-in hours for [facility] on the second floor.  He also was provided a note for his probation officer stating that he had been assessed in the urgent care. . At this time Darrell Jensen is educated and verbalizes understanding of mental health resources and other crisis services in the community.  He s instructed to call 911 and present to the nearest emergency room should he experience any suicidal/homicidal ideation, auditory/visual/hallucinations, or detrimental worsening of his mental health condition.   Flowsheet Row ED from 11/08/2023 in [facility] ED from 09/15/2023 in [facility]  C-SSRS RISK CATEGORY No Risk Moderate Risk       Psychiatric Specialty Exam  Presentation  General Appearance:Appropriate for Environment; Casual  Eye Contact:Good  Speech:Clear and Coherent; Normal Rate  Speech Volume:Normal  Handedness:Right   Mood and Affect  Mood:Euthymic  Affect:Congruent   Thought Process  Thought Processes:Coherent  Descriptions of Associations:Intact  Orientation:Full (Time, Place and Person)  Thought Content:Logical    Hallucinations:None  Ideas of Reference:None  Suicidal Thoughts:No  Homicidal Thoughts:No   Sensorium  Memory:Immediate Good; Recent Good; Remote Good  Judgment:Good  Insight:Good   Executive Functions  Concentration:Good  Attention Span:Good  Recall:Good  Fund of Knowledge:Good  Language:Good   Psychomotor Activity  Psychomotor Activity:Normal   Assets  Assets:Communication Skills; Desire for Improvement; Financial Resources/Insurance; Leisure Time;  Physical Health; Resilience; Social Support; Housing; Intimacy   Sleep  Sleep:Good  Number of hours: No data recorded  Physical Exam: Physical Exam Constitutional:      Appearance: Normal appearance.  Eyes:     General:        Right eye: No discharge.        Left eye: No discharge.  Cardiovascular:     Rate and Rhythm: Normal rate.  Pulmonary:     Effort: Pulmonary effort is normal. No respiratory distress.  Musculoskeletal:        General: Normal range of motion.     Cervical back: Normal range of motion.  Neurological:     Mental Status: He is alert and oriented to person, place, and time.    Review of Systems  Constitutional:  Negative for chills and fever.  HENT:  Negative for hearing loss.   Respiratory:  Negative for cough and shortness of breath.   Cardiovascular:  Negative for chest pain.  Musculoskeletal: Negative.   Neurological:  Negative for tremors.  Psychiatric/Behavioral:  Negative for depression and suicidal ideas. The patient is not nervous/anxious.    Blood pressure (!) 140/99, pulse 93, temperature 98.2 F (36.8 C), temperature source Oral, resp. rate 16, SpO2 98%. There is no height or weight on file to calculate BMI.  Musculoskeletal: Strength & Muscle Tone: within normal limits Gait & Station: normal Patient leans: N/A   [facility] MSE Discharge Disposition for Follow up and Recommendations: Based on my evaluation the patient does not appear to have an emergency medical condition and can be discharged with resources and follow up care in outpatient services for Medication Management and Individual Therapy  Discharge patient  Provided outpatient psychiatric resources for medication management and therapy.  Provided open access walk-in hours for [facility] on the second floor.   [name], NP 11/08/2023, 3:00 PM

## 2023-11-08 NOTE — Discharge Instructions (Signed)
Discharge recommendations:   Medications: Patient is to take medications as prescribed. The patient or patient's guardian is to contact a medical professional and/or outpatient provider to address any new side effects that develop. The patient or the patient's guardian should update outpatient providers of any new medications and/or medication changes.    Outpatient Follow up: Please review list of outpatient resources for psychiatry and counseling. Please follow up with your primary care provider for all medical related needs.    Therapy: We recommend that patient participate in individual therapy to address mental health concerns.   Atypical antipsychotics: If you are prescribed an atypical antipsychotic, it is recommended that your height, weight, BMI, blood pressure, fasting lipid panel, and fasting blood sugar be monitored by your outpatient providers.  Safety:   The following safety precautions should be taken:   No sharp objects. This includes scissors, razors, scrapers, and putty knives.   Chemicals should be removed and locked up.   Medications should be removed and locked up.   Weapons should be removed and locked up. This includes firearms, knives and instruments that can be used to cause injury.   The patient should abstain from use of illicit substances/drugs and abuse of any medications.  If symptoms worsen or do not continue to improve or if the patient becomes actively suicidal or homicidal then it is recommended that the patient return to the closest hospital emergency department, the [facility], or call 911 for further evaluation and treatment. National Suicide Prevention Lifeline 1-800-SUICIDE or 567-265-1791.  About 988 988 offers 24/7 access to trained crisis counselors who can help people experiencing mental health-related distress. People can call or text 988 or chat 988lifeline.org for themselves or if they are worried about a  loved one who may need crisis support.   Based on what you have shared, a list of resources for outpatient therapy and psychiatry is provided below to get you started back on treatment.  It is imperative that you follow through with treatment within 5-7 days from the day of discharge to prevent any further risk to your safety or mental well-being.  You are not limited to the list provided.  In case of an urgent crisis, you may contact the Mobile Crisis Unit with [facility] at 1.562-460-1106.        Outpatient Services for Therapy and Medication Management for [facility] [address], [place], [address] [phone] phone  New Patient Assessment/Therapy Walk-ins Monday and Wednesday: 8am until slots are full. Every 1st and 2nd Friday: 1pm - 5pm  NO ASSESSMENT/THERAPY WALK-INS ON TUESDAYS OR THURSDAYS  New Patient Psychiatry/Medication Management Walk-ins Monday-Friday: 8am-11am  For all walk-ins, we ask that you arrive by 7:30am because patient will be seen in the order of arrival.  Availability is limited; therefore, you may not be seen on the same day that you walk-in.  Our goal is to serve and meet the needs of our community to the best of our ability.   [facility] [address] [place], [place], [address] [phone] phone  [facility] [address] [place], [place], [address] [phone] phone [phone] phone ([address], [employer]them[employer], [address], [address], [address], [address], Healthy [place], [place], [place], [address], [employer], [place], UHC, [employer], [place], Out of Network)  [employer], [place] [address] [place], [place], [address] [phone] phone (Monia Pouch, Anthem[employer], [employer], [place],  [address], [place], [place], [place], [place], [employer],  [place], [place], [place], [facility])  [employer] 970 438 [address] [place], [place], [address] [phone] phone (Medicaid, ask about other insurance)  [facility] [address] [place], [place], [address] [phone] phone [phone] fax ([address] [place] , [place], [place], [place] Health Choice, UHC, [employer], Self-Pay)  [facility] [address] [place], [place], [address] [phone] phone ([address] Anthem/Elevance, [address], [address], [place], [employer], [place], [place], [place], [employer], [place], [place], [place], [facility])  [facility] - 6-8 MONTH WAIT FOR THERAPY; SOONER FOR MEDICATION MANAGEMENT [address], [address] [place], [place], [address] [phone] phone ([address] [employer] [address] - [place], [address], [place], [place], [employer] Health Plans, [address], BCBS Healthy [place], [place], [address], [place], [place], [place], [place], Tricare, UHC, [employer], [place])  [facility] [address], [address] [place], [place], [address] [phone] phone  [facility] [address], [address] [place], [place], [address] [phone] phone  [facility] [address] [place], [place], [address] [phone] phone  [facility] [place] - THERAPY ONLY [address] [place], [address] [phone] phone  [facility], [place] [address], [place], [address] [phone] phone  [facility] [address] [place], [place], [address] [phone] phone [phone] fax  [facility] [address] [place], [place], [address] [phone] phone [phone] fax  [facility] [phone] [address] [place], [place], [address] [phone] phone  Jovita Kussmaul [address] [place], [place], [address]  [phone] phone  [facility]   (Adults Only) [address] [place], [place], [address]  [phone] phone [phone] fax

## 2023-11-08 NOTE — ED Notes (Signed)
 Patient discharged home by provider. Transported by self.
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
